# Patient Record
Sex: Female | Born: 2005 | Race: White | Hispanic: No | Marital: Single | Smoking: Never smoker
Health system: Southern US, Community
[De-identification: ages and names within clinical notes are randomized; demographics above are authoritative.]

## PROBLEM LIST (undated history)

## (undated) DIAGNOSIS — F419 Anxiety disorder, unspecified: Secondary | ICD-10-CM

## (undated) HISTORY — PX: OTHER SURGICAL HISTORY: SHX169

---
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
## 2024-01-06 ENCOUNTER — Other Ambulatory Visit: Payer: Self-pay | Admitting: Dermatology

## 2024-01-06 DIAGNOSIS — L700 Acne vulgaris: Secondary | ICD-10-CM

## 2024-01-26 ENCOUNTER — Ambulatory Visit: Admitting: Dermatology

## 2024-01-26 ENCOUNTER — Encounter: Payer: Self-pay | Admitting: Dermatology

## 2024-01-26 VITALS — BP 114/73 | HR 81

## 2024-01-26 DIAGNOSIS — F4321 Adjustment disorder with depressed mood: Secondary | ICD-10-CM | POA: Insufficient documentation

## 2024-01-26 DIAGNOSIS — M419 Scoliosis, unspecified: Secondary | ICD-10-CM | POA: Insufficient documentation

## 2024-01-26 DIAGNOSIS — L905 Scar conditions and fibrosis of skin: Secondary | ICD-10-CM

## 2024-01-26 DIAGNOSIS — L700 Acne vulgaris: Secondary | ICD-10-CM | POA: Diagnosis not present

## 2024-01-26 DIAGNOSIS — Z789 Other specified health status: Secondary | ICD-10-CM | POA: Insufficient documentation

## 2024-01-26 DIAGNOSIS — F5105 Insomnia due to other mental disorder: Secondary | ICD-10-CM | POA: Insufficient documentation

## 2024-01-26 DIAGNOSIS — F329 Major depressive disorder, single episode, unspecified: Secondary | ICD-10-CM | POA: Insufficient documentation

## 2024-01-26 MED ORDER — ARAZLO 0.045 % EX LOTN
1.0000 | TOPICAL_LOTION | Freq: Every evening | CUTANEOUS | 4 refills | Status: AC
Start: 2024-01-26 — End: ?

## 2024-01-26 MED ORDER — SPIRONOLACTONE 50 MG PO TABS
150.0000 mg | ORAL_TABLET | Freq: Every day | ORAL | 6 refills | Status: AC
Start: 2024-01-26 — End: ?

## 2024-01-26 NOTE — Progress Notes (Signed)
   Follow-Up Visit   Subjective  Carla Bishop is a 18 y.o. female who presents for the following: Acne  Patient present today for follow up visit for Acne. Patient was last evaluated on 09/23/23. At this visit patient was prescribed Tretinoin  to 0.05% strength, to be used 3 nights per week, Spironolactone  100 mg tabs, she was also advise to wash with gentle cleansers in the morning followed by Clindamycin  Swabs. On the weekends she was recommended to wash with SA washes for gentle exfoliation. Patient reports sxs are unchanged. Patient denies medication changes. Patient reports she is not currently pregnant or trying to conceive.   The following portions of the chart were reviewed this encounter and updated as appropriate: medications, allergies, medical history  Review of Systems:  No other skin or systemic complaints except as noted in HPI or Assessment and Plan.  Objective  Well appearing patient in no apparent distress; mood and affect are within normal limits.  A focused examination was performed of the following areas: Face  Relevant exam findings are noted in the Assessment and Plan.           Assessment & Plan   1. Acne Vulgaris and Acne Scarring - Assessment: Patient's acne has shown improvement on the forehead but worsening on the cheeks. Comparison of photos from January to the current visit reveals post-inflammatory erythema without deep permanent scarring. Current treatment regimen includes spironolactone  and a topical retinoid, which has been partially effective. Given the persistent nature of the acne, particularly on the cheeks, treatment intensification is warranted.  - Plan:    Increase spironolactone  to 150 mg PO qhs    Discussed potentially starting isotretinoin if this new regimen doesn't wor    If lightheadedness or dizziness occurs, discontinue for a few days, then resume at 100 mg    Start Arazlo  (tazarotene ) cream at night    Begin with application 3 nights  per week, increase to 5 nights if tolerated    Adjust frequency based on tolerability and season    Continue morning application of clindamycin     Recommend Avene Tolerance cream for daytime moisturizing    Recommend Avene balm with copper and zinc calming peptides for nighttime moisturizing    Patient to follow up with OB/GYN for oral contraceptive initiation  Follow-up in 4 months to assess response to treatment modifications.    Return in about 6 months (around 07/28/2024) for Acne F/U.  I, Jetta Ager, am acting as Neurosurgeon for [employer], DO.  Documentation: I have reviewed the above documentation for accuracy and completeness, and I agree with the above.  Delon Lenis, DO

## 2024-01-26 NOTE — Patient Instructions (Addendum)
 Date: Wed Jan 26 2024  Hello Danita,  Thank you for visiting today. Here is a summary of the key instructions:  - Medications:   - Increase spironolactone  to 150 mg (3 tablets of 50 mg) at night   - If you feel dizzy, stop for a few days, then go back to 100 mg (2 tablets)   - Use Arazlo  cream 3 nights a week, then try 5 nights if you can   - Use clindamycin  in the morning  - Skin Care:   - Try Avene Tolerance cream for day use   - Try Avene Cicalfate balm with copper and zinc for night use   - If you like the Avene products, you can buy them at [employer] or [employer]  - Follow-up:   - Next appointment in 4 months  - Other Instructions:   - When the pharmacy contacts you, give them your [employer] information   Please reach out if you have any questions or concerns.  Warm regards,  Dr. Delon Lenis Dermatology      Important Information   Due to recent changes in healthcare laws, you may see results of your pathology and/or laboratory studies on MyChart before the doctors have had a chance to review them. We understand that in some cases there may be results that are confusing or concerning to you. Please understand that not all results are received at the same time and often the doctors may need to interpret multiple results in order to provide you with the best plan of care or course of treatment. Therefore, we ask that you please give us  2 business days to thoroughly review all your results before contacting the office for clarification. Should we see a critical lab result, you will be contacted sooner.     If You Need Anything After Your Visit   If you have any questions or concerns for your doctor, please call our main line at [phone]. If no one answers, please leave a voicemail as directed and we will return your call as soon as possible. Messages left after 4 pm will be answered the following business day.    You may also send us  a message via MyChart. We typically  respond to MyChart messages within 1-2 business days.  For prescription refills, please ask your pharmacy to contact our office. Our fax number is [phone].  If you have an urgent issue when the clinic is closed that cannot wait until the next business day, you can page your doctor at the number below.     Please note that while we do our best to be available for urgent issues outside of office hours, we are not available 24/7.    If you have an urgent issue and are unable to reach us , you may choose to seek medical care at your doctor's office, retail clinic, urgent care center, or emergency room.   If you have a medical emergency, please immediately call 911 or go to the emergency department. In the event of inclement weather, please call our main line at [phone] for an update on the status of any delays or closures.  Dermatology Medication Tips: Please keep the boxes that topical medications come in in order to help keep track of the instructions about where and how to use these. Pharmacies typically print the medication instructions only on the boxes and not directly on the medication tubes.   If your medication is too expensive, please contact our office at [phone]  or send us  a message through MyChart.    We are unable to tell what your co-pay for medications will be in advance as this is different depending on your insurance coverage. However, we may be able to find a substitute medication at lower cost or fill out paperwork to get insurance to cover a needed medication.    If a prior authorization is required to get your medication covered by your insurance company, please allow us  1-2 business days to complete this process.   Drug prices often vary depending on where the prescription is filled and some pharmacies may offer cheaper prices.   The website www.goodrx.com contains coupons for medications through different pharmacies. The prices here do not account for what  the cost may be with help from insurance (it may be cheaper with your insurance), but the website can give you the price if you did not use any insurance.  - You can print the associated coupon and take it with your prescription to the pharmacy.  - You may also stop by our office during regular business hours and pick up a GoodRx coupon card.  - If you need your prescription sent electronically to a different pharmacy, notify our office through [facility] or by phone at [phone]

## 2024-02-14 ENCOUNTER — Encounter: Payer: Self-pay | Admitting: Dermatology

## 2024-02-14 DIAGNOSIS — L700 Acne vulgaris: Secondary | ICD-10-CM

## 2024-02-14 MED ORDER — METHYLPREDNISOLONE 4 MG PO TBPK: ORAL_TABLET | ORAL | 0 refills | Status: AC

## 2024-02-14 NOTE — Telephone Encounter (Signed)
 Yes, this is purging, it can last 4-6 weeks.  We can send a rx of medrol  dose pack to help it clear faster if she'd like.

## 2024-02-16 ENCOUNTER — Other Ambulatory Visit (HOSPITAL_BASED_OUTPATIENT_CLINIC_OR_DEPARTMENT_OTHER): Payer: Self-pay

## 2024-02-28 ENCOUNTER — Encounter (HOSPITAL_BASED_OUTPATIENT_CLINIC_OR_DEPARTMENT_OTHER): Payer: Self-pay

## 2024-02-28 ENCOUNTER — Other Ambulatory Visit (HOSPITAL_BASED_OUTPATIENT_CLINIC_OR_DEPARTMENT_OTHER): Payer: Self-pay

## 2024-03-06 ENCOUNTER — Other Ambulatory Visit (HOSPITAL_BASED_OUTPATIENT_CLINIC_OR_DEPARTMENT_OTHER): Payer: Self-pay

## 2024-03-06 MED ORDER — SPIRONOLACTONE 100 MG PO TABS
100.0000 mg | ORAL_TABLET | Freq: Every day | ORAL | 5 refills | Status: AC
  Filled 2024-03-06: qty 30, 30d supply, fill #0
  Filled 2024-05-12: qty 30, 30d supply, fill #1

## 2024-03-07 ENCOUNTER — Other Ambulatory Visit (HOSPITAL_BASED_OUTPATIENT_CLINIC_OR_DEPARTMENT_OTHER): Payer: Self-pay

## 2024-03-07 ENCOUNTER — Other Ambulatory Visit: Payer: Self-pay

## 2024-03-07 MED ORDER — CLINDAMYCIN PHOSPHATE 1 % EX SWAB
CUTANEOUS | 2 refills | Status: AC
  Filled 2024-03-07: qty 60, 30d supply, fill #0
  Filled 2024-05-12: qty 60, 30d supply, fill #1

## 2024-03-07 NOTE — Addendum Note (Signed)
 Addended by: Tonye Tancredi U on: 03/07/2024 07:29 AM   Modules accepted: Orders

## 2024-03-23 ENCOUNTER — Encounter: Payer: Self-pay | Admitting: Dermatology

## 2024-04-19 ENCOUNTER — Encounter: Payer: Self-pay | Admitting: Dermatology

## 2024-04-19 ENCOUNTER — Encounter: Payer: Self-pay | Admitting: Obstetrics & Gynecology

## 2024-05-08 ENCOUNTER — Ambulatory Visit: Admitting: Obstetrics & Gynecology

## 2024-05-12 ENCOUNTER — Encounter (HOSPITAL_BASED_OUTPATIENT_CLINIC_OR_DEPARTMENT_OTHER): Payer: Self-pay

## 2024-05-12 ENCOUNTER — Other Ambulatory Visit (HOSPITAL_BASED_OUTPATIENT_CLINIC_OR_DEPARTMENT_OTHER): Payer: Self-pay

## 2024-05-15 ENCOUNTER — Telehealth: Payer: Self-pay | Admitting: Dermatology

## 2024-05-15 NOTE — Telephone Encounter (Signed)
 Left voicemail asking patient to return call to discuss Arazlo  and hydrocortisone usage

## 2024-05-15 NOTE — Telephone Encounter (Signed)
 Please make sure pt is taking a break from her Arazlo  as the weather is changing.  After looking back at prior messages, She should use the OTC hydrocortisone ointment for 7 days as we recommended last winter when this happened before.  Thanks, Dr Alm

## 2024-07-31 ENCOUNTER — Ambulatory Visit: Admitting: Dermatology
